# Patient Record
Sex: Female | Born: 1980 | Hispanic: Yes | Marital: Single | Smoking: Never smoker
Health system: Southern US, Community
[De-identification: ages and names within clinical notes are randomized; demographics above are authoritative.]

---

## 2004-08-22 ENCOUNTER — Emergency Department: Payer: Self-pay | Admitting: Emergency Medicine

## 2005-11-16 ENCOUNTER — Ambulatory Visit: Payer: Self-pay | Admitting: Family Medicine

## 2011-02-16 ENCOUNTER — Emergency Department: Payer: Self-pay | Admitting: Emergency Medicine

## 2011-06-12 ENCOUNTER — Emergency Department: Payer: Self-pay | Admitting: Emergency Medicine

## 2011-06-12 LAB — CBC
HCT: 38.5 % (ref 35.0–47.0)
HGB: 12.8 g/dL (ref 12.0–16.0)
MCHC: 33.3 g/dL (ref 32.0–36.0)
MCV: 85 fL (ref 80–100)
RBC: 4.52 10*6/uL (ref 3.80–5.20)
RDW: 16.2 % — ABNORMAL HIGH (ref 11.5–14.5)
WBC: 8.1 10*3/uL (ref 3.6–11.0)

## 2011-06-12 LAB — PROTIME-INR: Prothrombin Time: 13.2 secs (ref 11.5–14.7)

## 2011-06-12 LAB — COMPREHENSIVE METABOLIC PANEL
Albumin: 3.6 g/dL (ref 3.4–5.0)
Bilirubin,Total: 0.2 mg/dL (ref 0.2–1.0)
Chloride: 104 mmol/L (ref 98–107)
Co2: 24 mmol/L (ref 21–32)
Creatinine: 0.51 mg/dL — ABNORMAL LOW (ref 0.60–1.30)
EGFR (African American): >60
Potassium: 3.8 mmol/L (ref 3.5–5.1)
SGPT (ALT): 29 U/L

## 2011-06-12 LAB — CK TOTAL AND CKMB (NOT AT ARMC)
CK, Total: 77 U/L (ref 21–215)
CK-MB: 0.5 ng/mL (ref 0.5–3.6)

## 2011-06-12 LAB — PREGNANCY, URINE: Pregnancy Test, Urine: NEGATIVE m[IU]/mL

## 2011-06-12 LAB — APTT: Activated PTT: 30.8 secs (ref 23.6–35.9)

## 2011-06-12 LAB — TROPONIN I: Troponin-I: <0.02 ng/mL

## 2013-01-30 IMAGING — CR DG CHEST 2V
1 series · 2 of 2 positions shown · non-contrast
Comparison: none

REASON FOR EXAM: left-sided chest pain
COMMENTS:

[Series 1: w chest pa · 0.14mm/px · 2 of 2 slices shown]
[im 1/2]
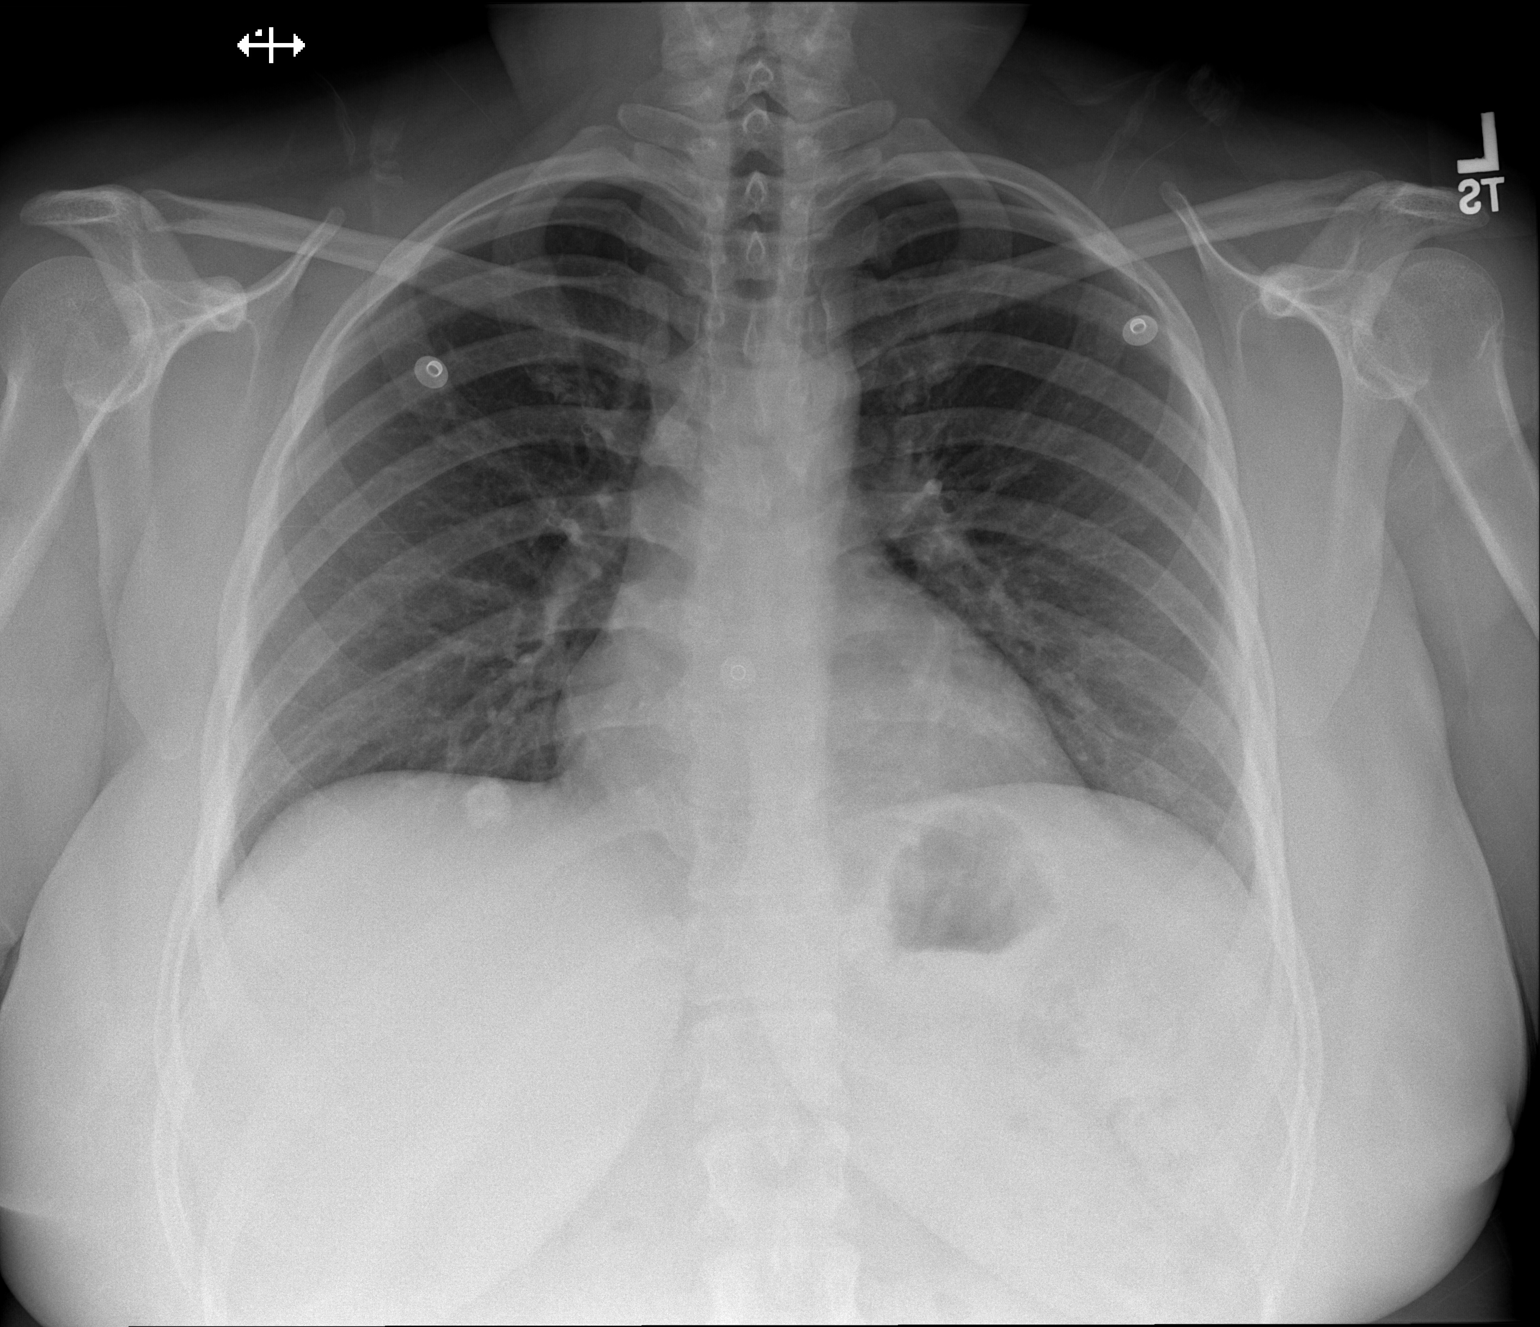
[im 2/2]
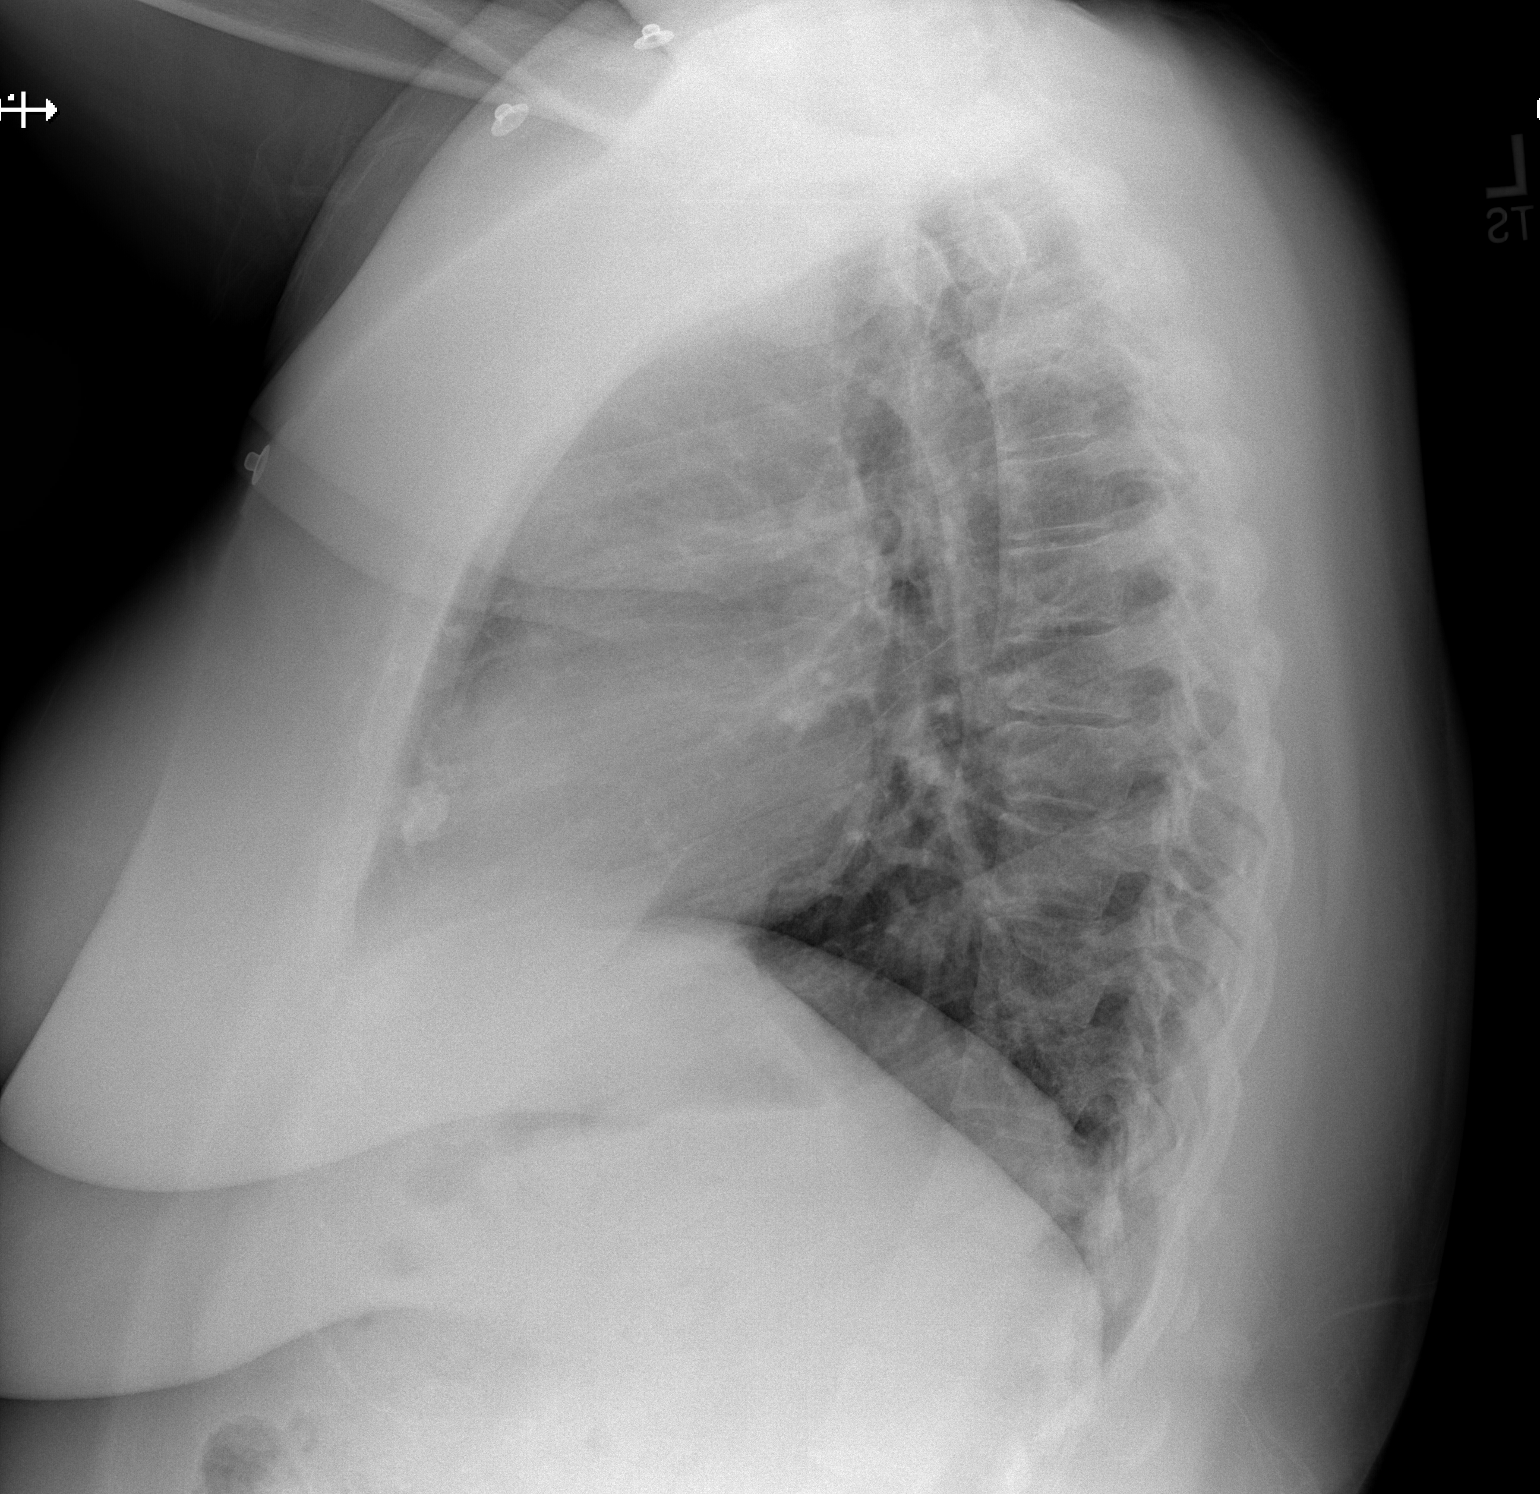

[2 of 2 positions shown; findings below may reference images not displayed]

PROCEDURE:     DXR - DXR CHEST PA (OR AP) AND LATERAL  - June 12, 2011  [DATE]

RESULT:     There is no previous exam for comparison.

The lungs are clear. The heart and pulmonary vessels are normal. The bony
and mediastinal structures are unremarkable. There is no effusion. There is
no pneumothorax or evidence of congestive failure.
IMPRESSION: No acute cardiopulmonary disease.

## 2013-01-30 IMAGING — CT CT CHEST W/ CM
2 series · 15 of 31 positions shown, 19 images · IV contrast (APPLIED)
Comparison: none

REASON FOR EXAM: pleuritic chest pain + elevated ddimer
COMMENTS:

PROCEDURE:     CT  - CT CHEST (FOR PE) W  - June 12, 2011  [DATE]
RESULT:     Comparison: None
TECHNIQUE: Multiple thin section axial images were obtained from the lung
apices to the upper abdomen following 100 ml Isovue 370 intravenous
contrast, according to the PE protocol. These images were also reviewed on a
[employer] multiplanar work station.

[Series 4: soft tissue · axial · 0.75mm/px · z∈[-926,-884]mm · 2 of 94 slices shown]
[im 8/94  mediastinal]
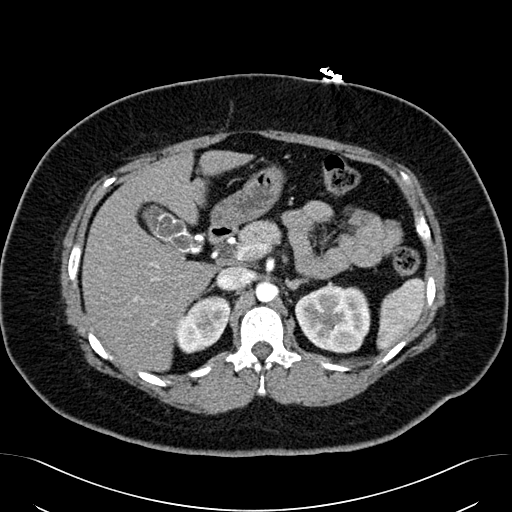
[im 22/94  mediastinal]
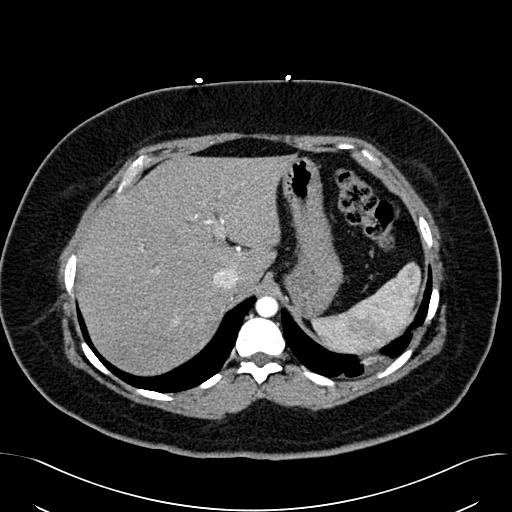

[Series 5: lung windows · axial · 0.75mm/px · z∈[-920,-690]mm · 13 of 92 slices shown, 17 images]
[im 8/92  mediastinal]
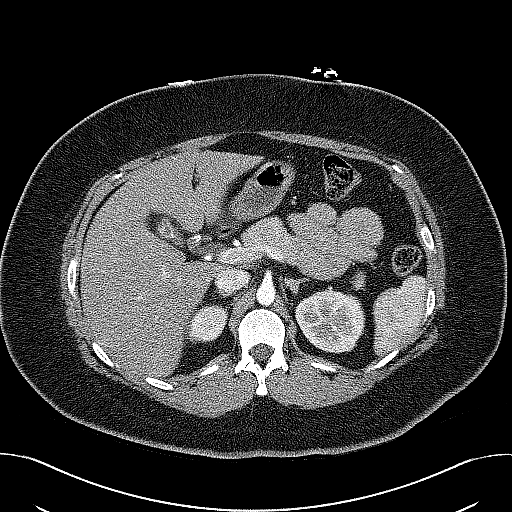
[im 8/92  lung]
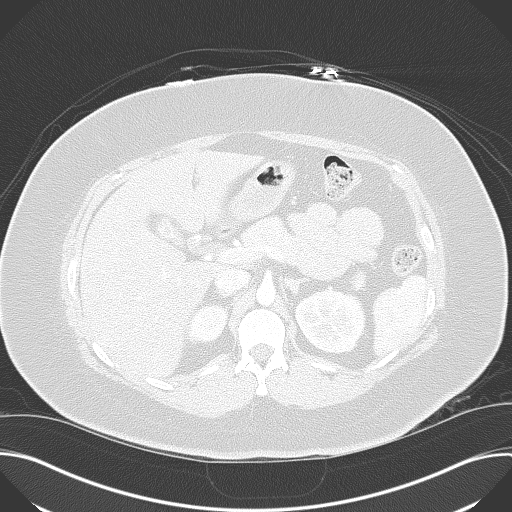
[im 15/92  lung]
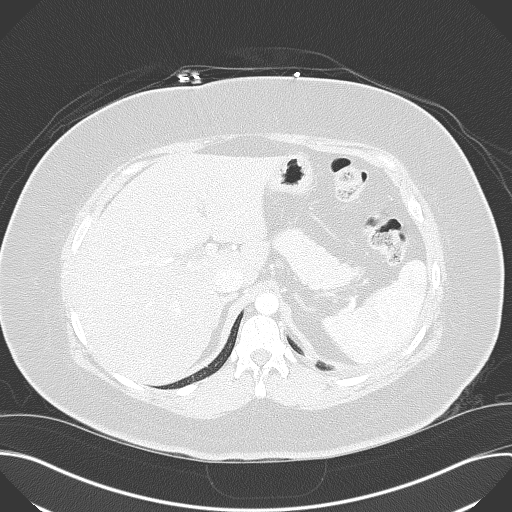
[im 22/92  lung]
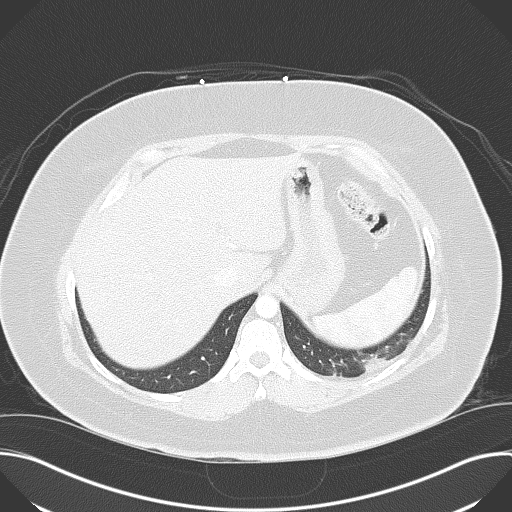
[im 29/92  lung]
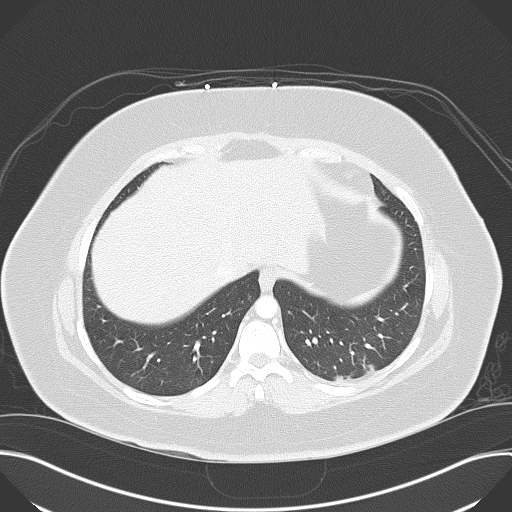
[im 36/92  mediastinal]
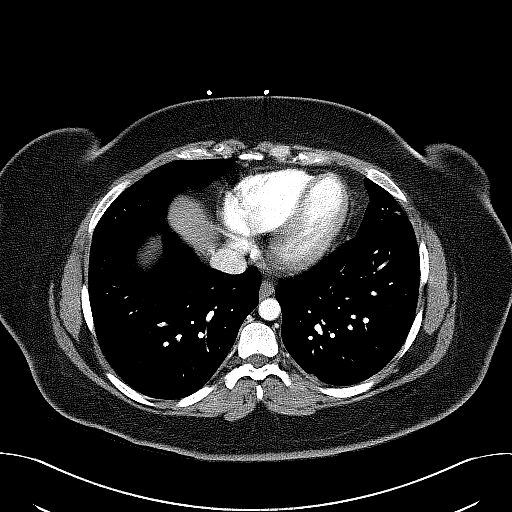
[im 36/92  lung]
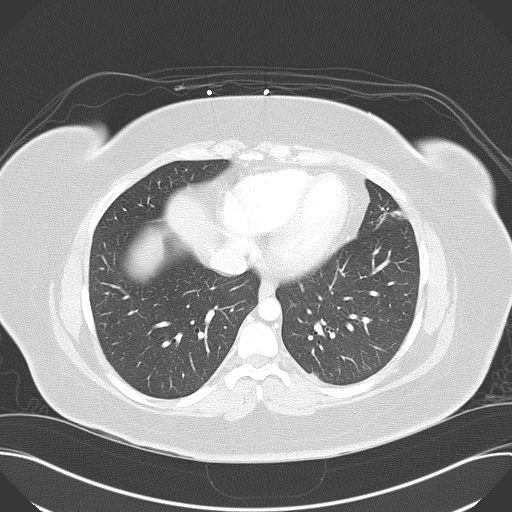
[im 43/92  lung]
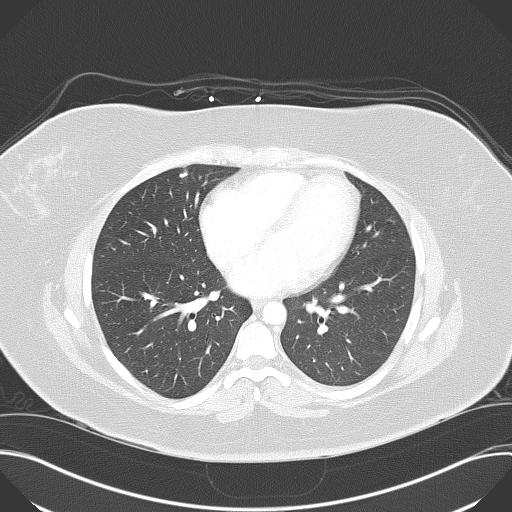
[im 46/92  lung]
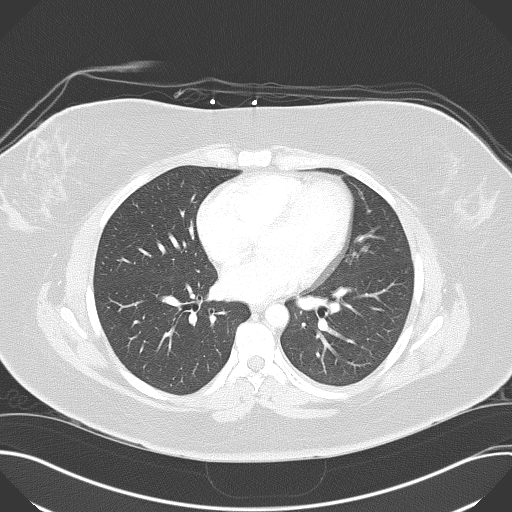
[im 50/92  lung]
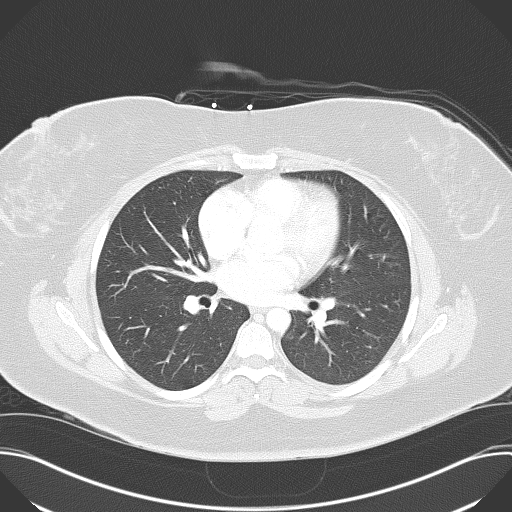
[im 57/92  mediastinal]
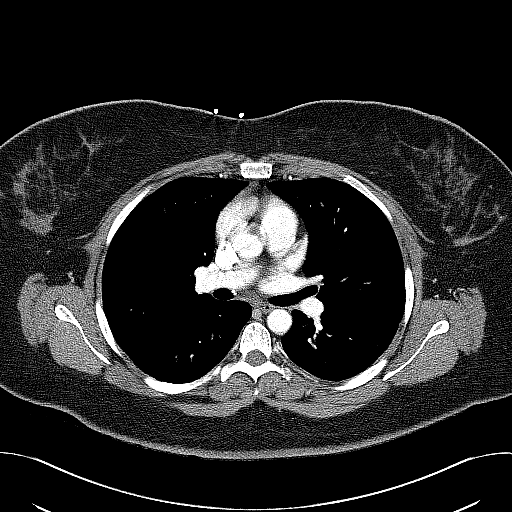
[im 57/92  lung]
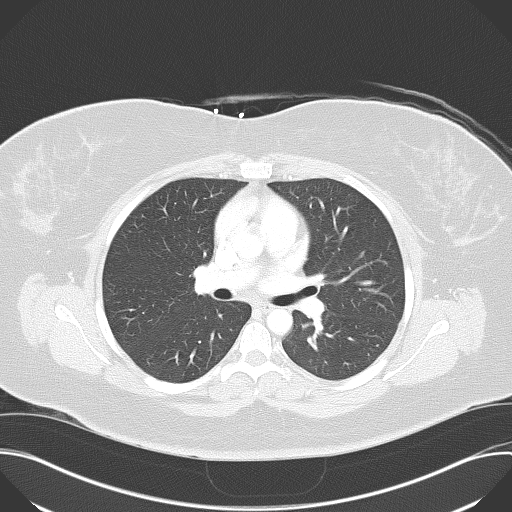
[im 64/92  lung]
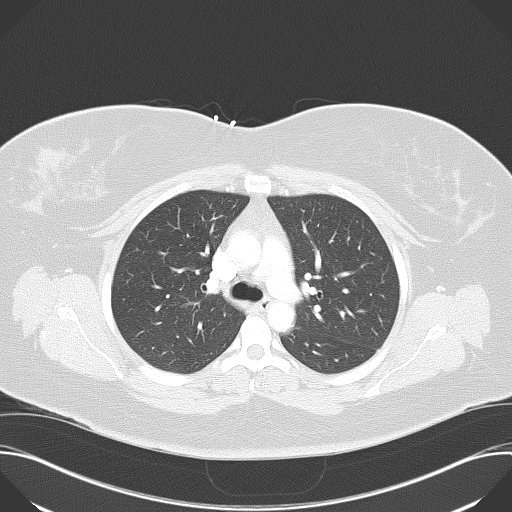
[im 71/92  lung]
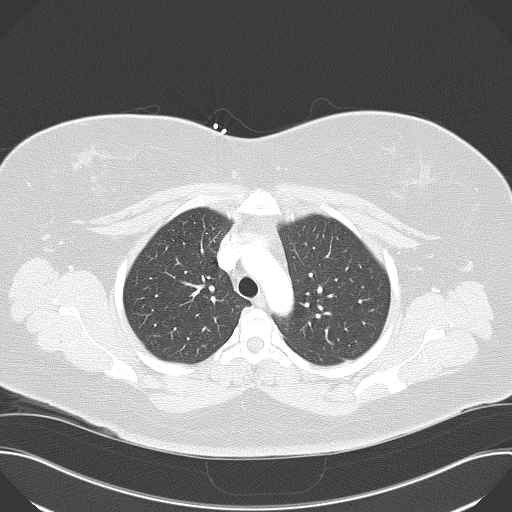
[im 78/92  lung]
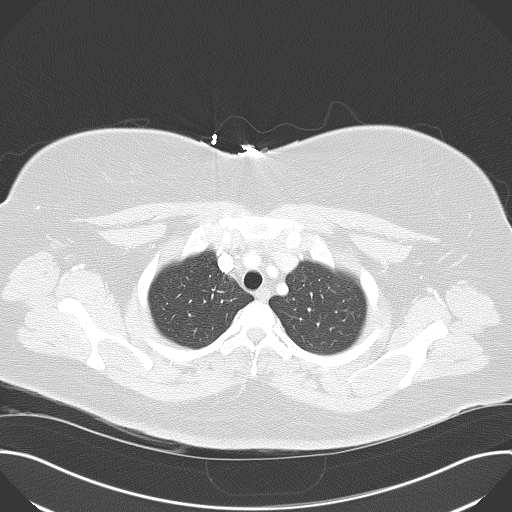
[im 85/92  mediastinal]
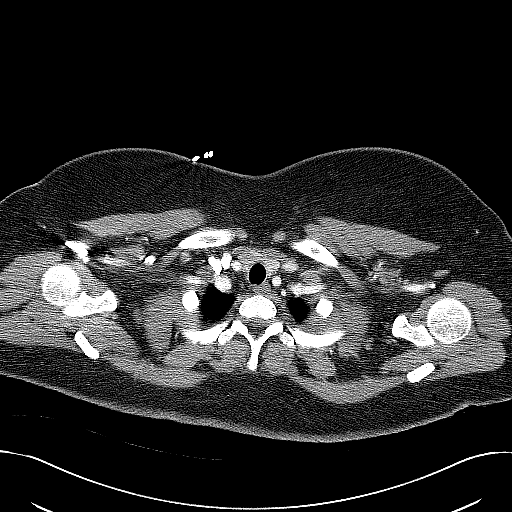
[im 85/92  lung]
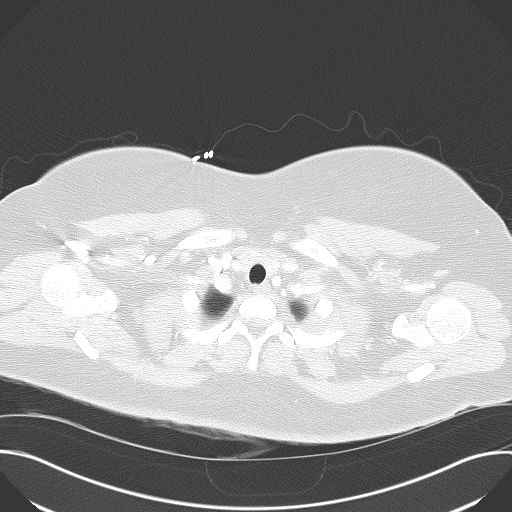

[15 of 31 positions shown; findings below may reference images not displayed]

FINDINGS: No mediastinal, hilar, or axillary lymphadenopathy. There are multiple
gallstones present. Calcifications in the subcarinal lymph nodes are likely
sequela of old prior infection.

The thoracic aorta is normal in caliber. No pulmonary embolus identified.

There is a large calcified nodule in the right middle lobe, likely sequela
of old prior infection. Mild left basilar opacities are likely secondary to
atelectasis.

No aggressive lytic or sclerotic osseous lesions are identified.
IMPRESSION: No pulmonary embolus identified.

## 2017-08-19 ENCOUNTER — Other Ambulatory Visit: Payer: Self-pay | Admitting: Physician Assistant

## 2017-08-19 DIAGNOSIS — Z3482 Encounter for supervision of other normal pregnancy, second trimester: Secondary | ICD-10-CM

## 2017-08-21 ENCOUNTER — Ambulatory Visit
Admission: RE | Admit: 2017-08-21 | Discharge: 2017-08-21 | Disposition: A | Discharge status: Home / Self Care | Payer: Self-pay | Source: Ambulatory Visit | Attending: Physician Assistant | Admitting: Physician Assistant

## 2017-08-21 DIAGNOSIS — Z3482 Encounter for supervision of other normal pregnancy, second trimester: Secondary | ICD-10-CM | POA: Insufficient documentation

## 2019-04-11 IMAGING — US US OB COMP +14 WK
1 series · 13 of 28 positions shown · non-contrast
Comparison: none

CLINICAL DATA: Anatomic scan

EXAM:
OBSTETRICAL ULTRASOUND >14 WKS

[Series 1: us ob comp +14 wk · 13 of 87 slices shown]
[im 4/87]
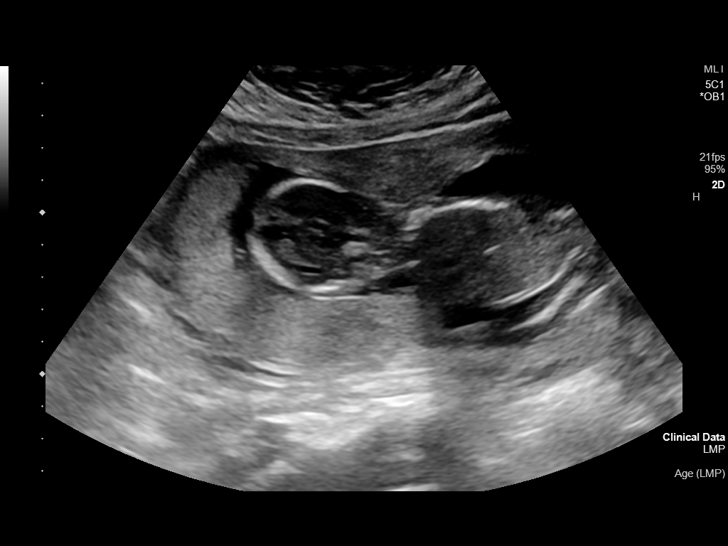
[im 10/87]
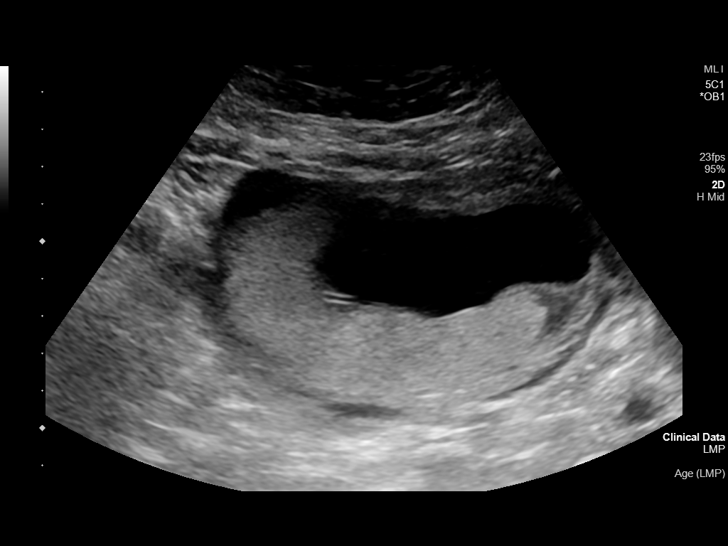
[im 16/87]
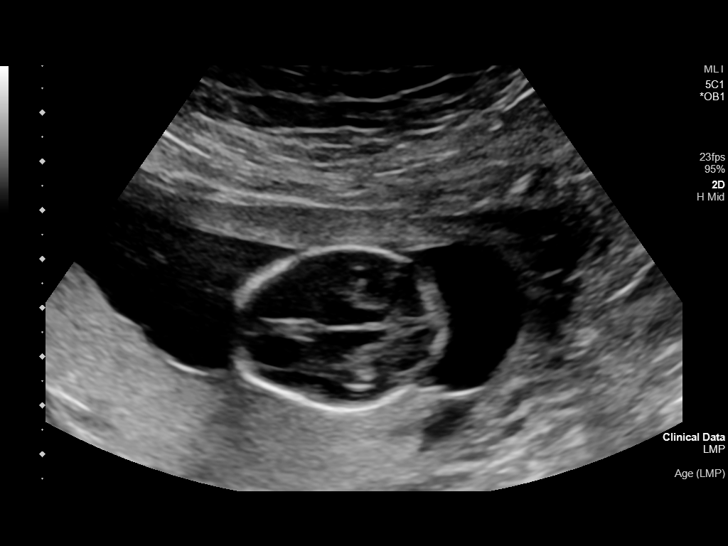
[im 23/87]
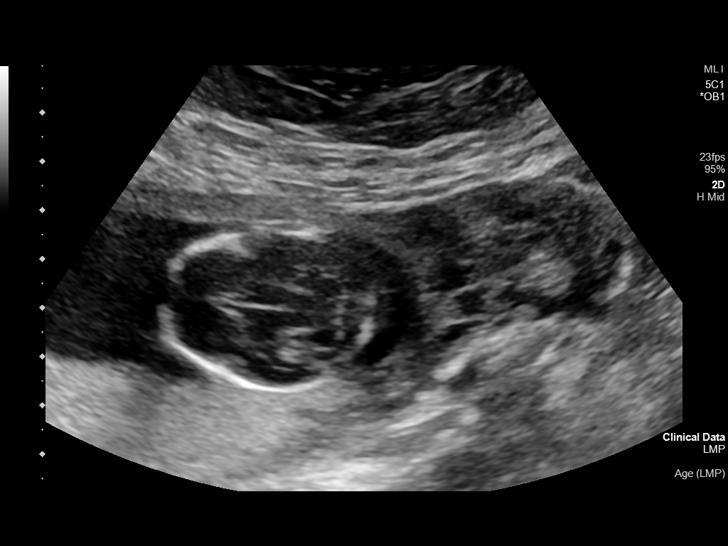
[im 29/87]
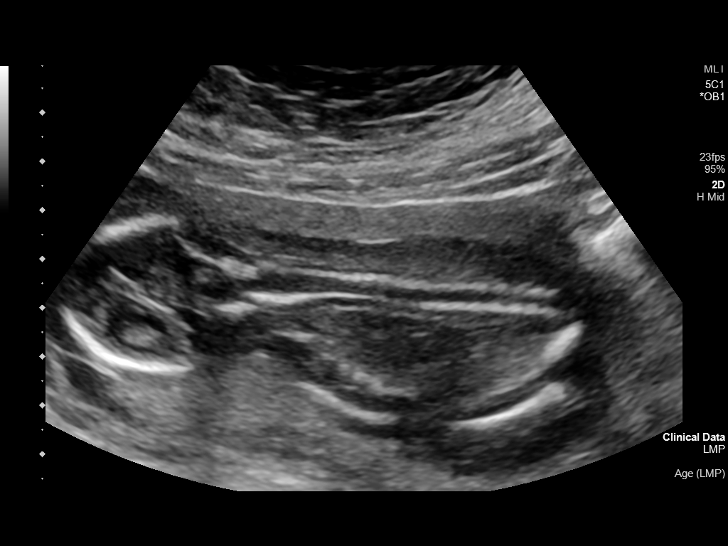
[im 36/87]
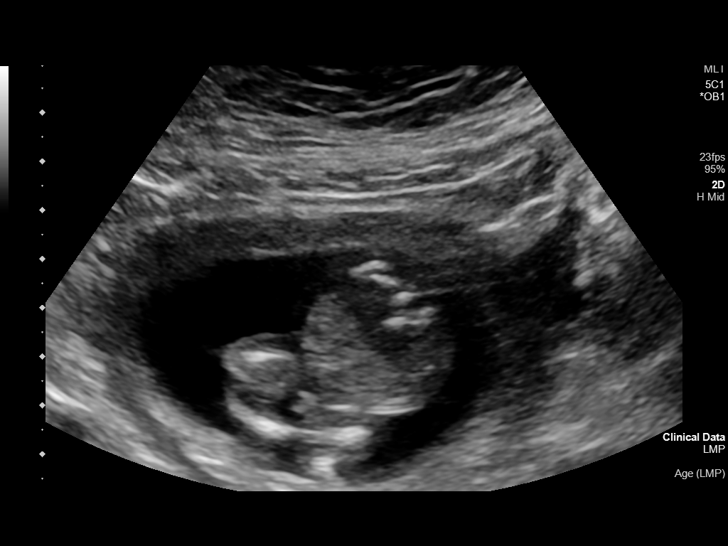
[im 45/87]
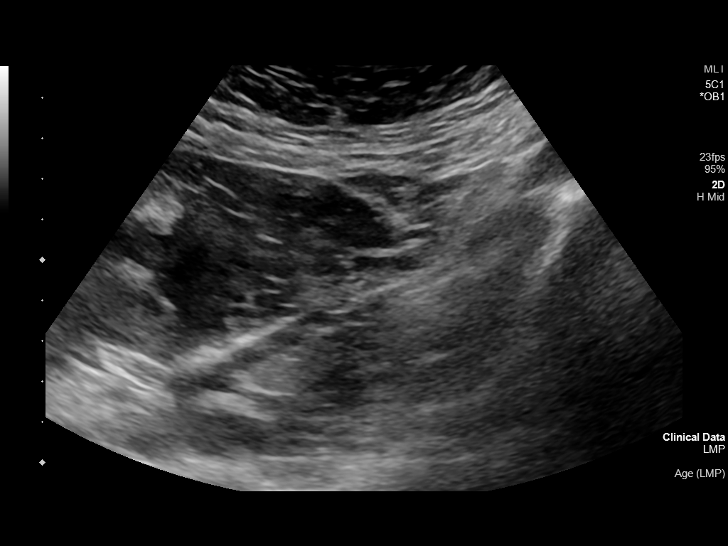
[im 51/87]
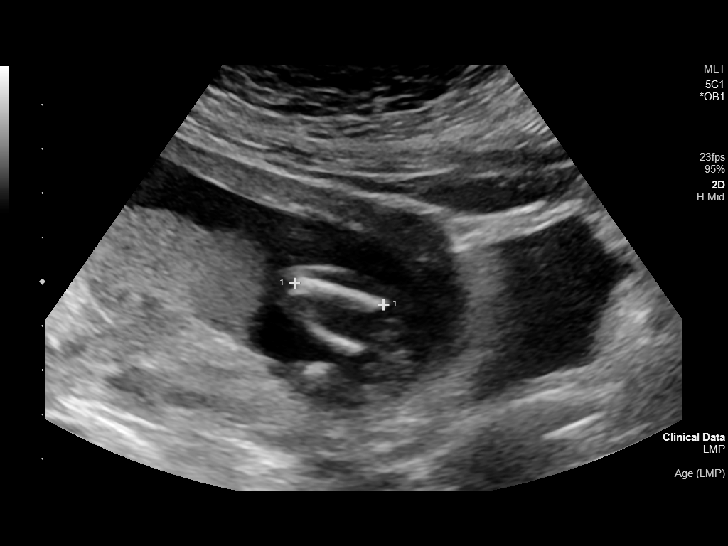
[im 58/87]
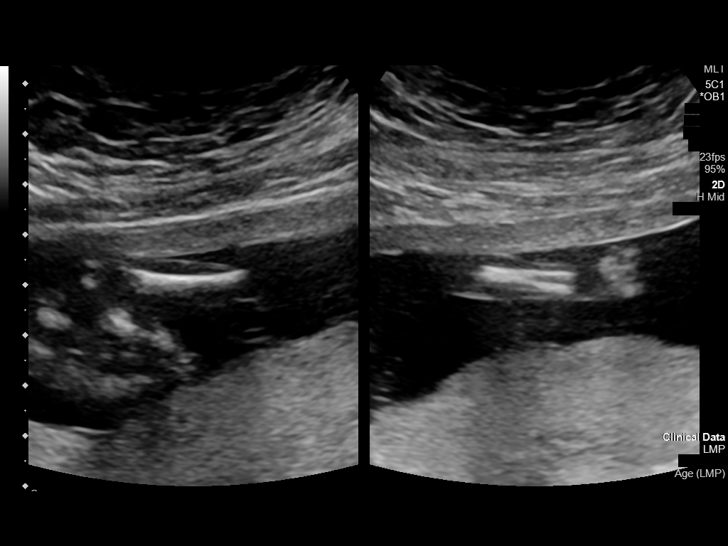
[im 64/87]
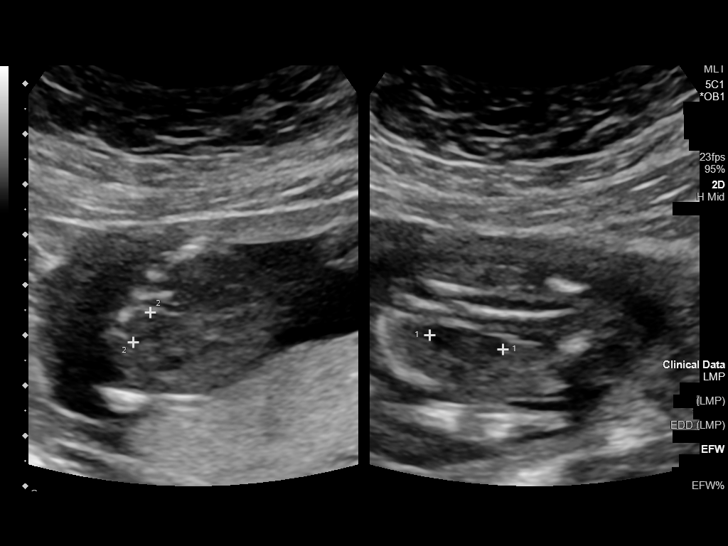
[im 71/87]
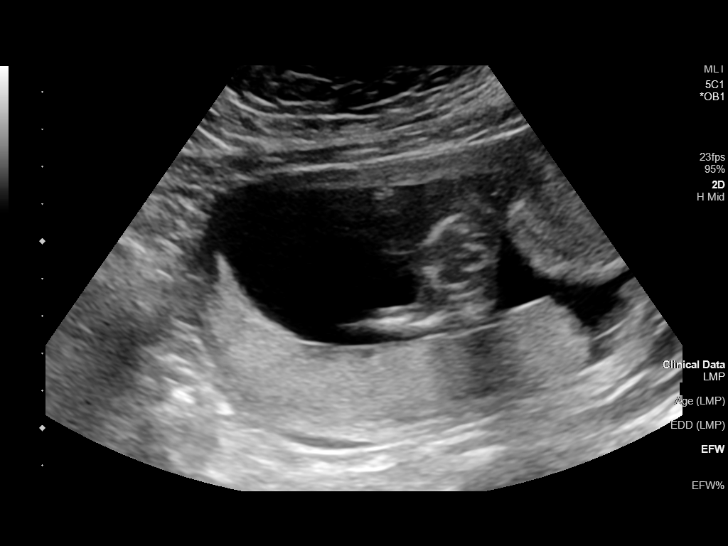
[im 77/87]
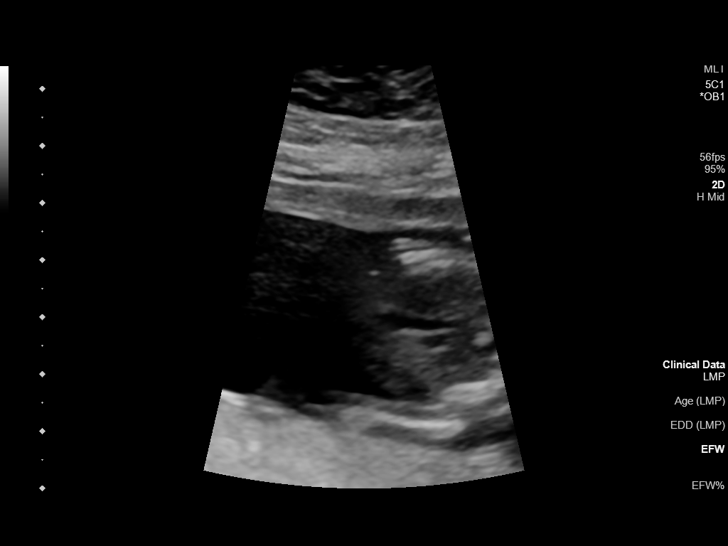
[im 83/87]
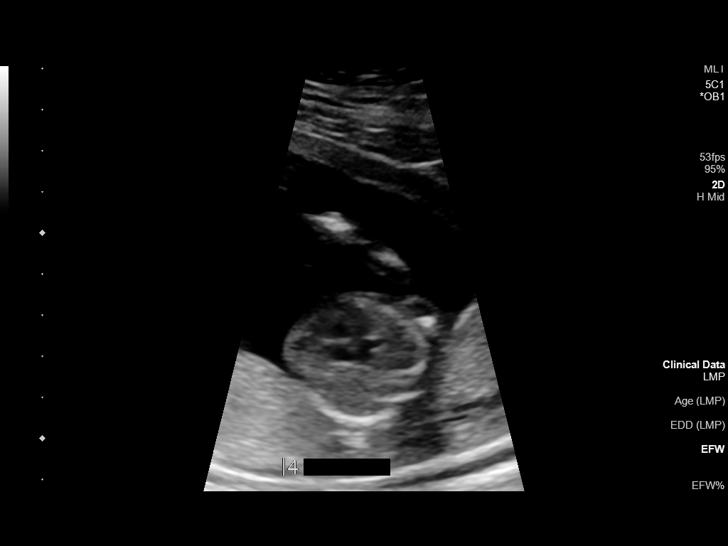

[13 of 28 positions shown; findings below may reference images not displayed]

FINDINGS: Number of Fetuses: 1

Heart Rate:  147 bpm

Movement: Present

Presentation: Breech

Previa: The distance from the lower placental segment to the
internal cervical os is 5.5 cm

Placental Location: Posterior

Amniotic Fluid (Subjective): Normal

Vertical pocket 4.4 cmcm

FETAL BIOMETRY

BPD:  3.28cm 16w 1d

HC:    12.67cm 16w 3d

AC:   10.3cm 16w 2d

FL:   2.06cm 16w 1d

Current Mean GA: 16w 1d US EDC: February 04, 2018.

FETAL ANATOMY

Lateral Ventricles: Visualized

Thalami/CSP: Visualized

Posterior Fossa:  Visualized

Nuchal Region: Visualized

Upper Lip: Visualized

Spine: Visualized

4 Chamber Heart on Left: Limited visualization

LVOT: Limited visualization

RVOT: Not visualized

Stomach on Left: Visualized

3 Vessel Cord: Visualized

Cord Insertion site: Visualized

Kidneys: Visualized

Bladder: Visualized

Extremities: Visualize

Sex: Male genitalia.

Technically difficult due to: Early gestational age and maternal
body habitus.

Maternal Findings: Uterus and adnexal regions within the limits of
normal.

Cervix:  3.3 cm, closed
IMPRESSION: Single viable IUP P with breech presentation and estimated
gestational age of 16 weeks 1 day with estimated date of confinement
February 04, 2018. This is 1 day less mature than that predicted
on clinical grounds. No fetal anomalies are observed but the
visualization of the cardiac structures was limited. The placenta is
posterior with no evidence of previa. The amniotic fluid volume is
estimated to be normal.

## 2020-05-14 ENCOUNTER — Other Ambulatory Visit: Payer: Self-pay

## 2020-05-14 DIAGNOSIS — Z20822 Contact with and (suspected) exposure to covid-19: Secondary | ICD-10-CM

## 2020-05-15 LAB — SARS-COV-2, NAA 2 DAY TAT

## 2020-05-15 LAB — NOVEL CORONAVIRUS, NAA: SARS-CoV-2, NAA: NOT DETECTED

## 2021-07-13 ENCOUNTER — Emergency Department: Payer: Self-pay

## 2021-07-13 ENCOUNTER — Emergency Department
Admission: EM | Admit: 2021-07-13 | Discharge: 2021-07-14 | Disposition: A | Discharge status: Home / Self Care | Payer: Self-pay | Attending: Emergency Medicine | Admitting: Emergency Medicine

## 2021-07-13 ENCOUNTER — Other Ambulatory Visit: Payer: Self-pay

## 2021-07-13 ENCOUNTER — Encounter: Payer: Self-pay | Admitting: Emergency Medicine

## 2021-07-13 DIAGNOSIS — A084 Viral intestinal infection, unspecified: Secondary | ICD-10-CM | POA: Insufficient documentation

## 2021-07-13 DIAGNOSIS — Z20822 Contact with and (suspected) exposure to covid-19: Secondary | ICD-10-CM | POA: Insufficient documentation

## 2021-07-13 DIAGNOSIS — R112 Nausea with vomiting, unspecified: Secondary | ICD-10-CM

## 2021-07-13 LAB — CBC WITH DIFFERENTIAL/PLATELET
Abs Immature Granulocytes: 0.02 10*3/uL (ref 0.00–0.07)
Basophils Absolute: 0 10*3/uL (ref 0.0–0.1)
Basophils Relative: 0 %
Eosinophils Absolute: 0 10*3/uL (ref 0.0–0.5)
Eosinophils Relative: 1 %
HCT: 42.8 % (ref 36.0–46.0)
Hemoglobin: 14.2 g/dL (ref 12.0–15.0)
Immature Granulocytes: 0 %
Lymphocytes Relative: 13 %
Lymphs Abs: 0.9 10*3/uL (ref 0.7–4.0)
MCH: 29.8 pg (ref 26.0–34.0)
MCHC: 33.2 g/dL (ref 30.0–36.0)
MCV: 89.9 fL (ref 80.0–100.0)
Monocytes Absolute: 0.3 10*3/uL (ref 0.1–1.0)
Monocytes Relative: 4 %
Neutro Abs: 5.6 10*3/uL (ref 1.7–7.7)
Neutrophils Relative %: 82 %
Platelets: 273 10*3/uL (ref 150–400)
RBC: 4.76 MIL/uL (ref 3.87–5.11)
RDW: 12.5 % (ref 11.5–15.5)
WBC: 6.8 10*3/uL (ref 4.0–10.5)
nRBC: 0 % (ref 0.0–0.2)

## 2021-07-13 MED ORDER — KETOROLAC TROMETHAMINE 15 MG/ML IJ SOLN
15.0000 mg | Freq: Once | INTRAMUSCULAR | Status: AC
  Administered 2021-07-14: 15 mg via INTRAVENOUS
  Filled 2021-07-13: qty 1

## 2021-07-13 MED ORDER — LACTATED RINGERS IV BOLUS
1000.0000 mL | Freq: Once | INTRAVENOUS | Status: AC
  Administered 2021-07-14: 1000 mL via INTRAVENOUS

## 2021-07-13 MED ORDER — ONDANSETRON HCL 4 MG/2ML IJ SOLN
4.0000 mg | Freq: Once | INTRAMUSCULAR | Status: AC
  Administered 2021-07-14: 4 mg via INTRAVENOUS
  Filled 2021-07-13: qty 2

## 2021-07-13 MED ORDER — FAMOTIDINE IN NACL 20-0.9 MG/50ML-% IV SOLN
20.0000 mg | Freq: Once | INTRAVENOUS | Status: AC
  Administered 2021-07-14: 20 mg via INTRAVENOUS
  Filled 2021-07-13: qty 50

## 2021-07-13 NOTE — ED Triage Notes (Addendum)
Pt reports upper abd pain radiating into upper back accomp by N/V and "acid" since yesterday; denies hx of same ?

## 2021-07-13 NOTE — ED Provider Notes (Signed)
? ?[facility] ?Provider Note ? ? ? Event Date/Time  ? First MD Initiated Contact with Patient 07/13/21 2332   ?  (approximate) ? ? ?History  ? ?Abdominal Pain ? ? ?HPI ? ?Kristina Herrera is a 41 y.o. female who presents for evaluation of vomiting, diarrhea, and abdominal pain.  Patient reports that her children at home have had vomiting and diarrhea.  She started with symptoms yesterday.  Reports several episodes of nonbloody nonbilious emesis and watery diarrhea.  Also has a mild cough and congestion.  She reports that this evening she started having severe epigastric sharp abdominal pain that radiated straight into her back.  After vomiting several times she reports that the pain has now improved but still persistent.  She does report a history of cholecystectomy but denies any other abdominal surgeries.  She has had body aches and chills but denies fever.  No chest pain, no shortness of breath. ?  ? ? ? ?Physical Exam  ? ?Triage Vital Signs: ?ED Triage Vitals [07/13/21 2333]  ?Enc Vitals Group  ?   BP   ?   Pulse   ?   Resp   ?   Temp   ?   Temp src   ?   SpO2   ?   Weight 180 lb (81.6 kg)  ?   Height 5\' 4"  (1.626 m)  ?   Head Circumference   ?   Peak Flow   ?   Pain Score 10  ?   Pain Loc   ?   Pain Edu?   ?   Excl. in GC?   ? ? ?Most recent vital signs: ?Vitals:  ? 07/14/21 0000 07/14/21 0234  ?BP: 117/68 (!) 99/53  ?Pulse: 82 83  ?Resp: 15 12  ?Temp:    ?SpO2: 95% 97%  ? ? ? ?Constitutional: Alert and oriented. Well appearing and in no apparent distress. ?HEENT: ?     Head: Normocephalic and atraumatic.    ?     Eyes: Conjunctivae are normal. Sclera is non-icteric.  ?     Mouth/Throat: Mucous membranes are moist.  ?     Neck: Supple with no signs of meningismus. ?Cardiovascular: Regular rate and rhythm. No murmurs, gallops, or rubs. 2+ symmetrical distal pulses are present in all extremities.  ?Respiratory: Normal respiratory effort. Lungs are clear to auscultation bilaterally.   ?Gastrointestinal: Soft, mild epigastric tenderness, and non distended with positive bowel sounds. No rebound or guarding. ?Genitourinary: No CVA tenderness. ?Musculoskeletal:  No edema, cyanosis, or erythema of extremities. ?Neurologic: Normal speech and language. Face is symmetric. Moving all extremities. No gross focal neurologic deficits are appreciated. ?Skin: Skin is warm, dry and intact. No rash noted. ?Psychiatric: Mood and affect are normal. Speech and behavior are normal. ? ?ED Results / Procedures / Treatments  ? ?Labs ?(all labs ordered are listed, but only abnormal results are displayed) ?Labs Reviewed  ?COMPREHENSIVE METABOLIC PANEL - Abnormal; Notable for the following components:  ?    Result Value  ? Glucose, Bld 121 (*)   ? Calcium 8.3 (*)   ? All other components within normal limits  ?URINALYSIS, COMPLETE (UACMP) WITH MICROSCOPIC - Abnormal; Notable for the following components:  ? Color, Urine YELLOW (*)   ? APPearance CLOUDY (*)   ? Specific Gravity, Urine 1.031 (*)   ? Ketones, ur 80 (*)   ? Protein, ur 30 (*)   ? Leukocytes,Ua TRACE (*)   ? All other  components within normal limits  ?RESP PANEL BY RT-PCR (FLU A&B, COVID) ARPGX2  ?URINE CULTURE  ?CBC WITH DIFFERENTIAL/PLATELET  ?LIPASE, BLOOD  ?HCG, QUANTITATIVE, PREGNANCY  ?TROPONIN I (HIGH SENSITIVITY)  ? ? ? ?EKG ? ?ED ECG REPORT ?I, Nita Sicklearolina Martinez Boxx, the attending physician, personally viewed and interpreted this ECG. ? ?Sinus rhythm with a rate of 88, normal intervals, normal axis, no ST elevations or depressions ? ?RADIOLOGY ?I, Nita Sicklearolina Hendryx Ricke, attending MD, have personally viewed and interpreted the images obtained during this visit as below: ? ?Chest x-ray is negative for acute disease ? ? ?___________________________________________________ ?Interpretation by Radiologist:  ?DG Chest Portable 1 View ? ?Result Date: 07/14/2021 ?CLINICAL DATA:  Cough EXAM: PORTABLE CHEST 1 VIEW COMPARISON:  06/12/2011 FINDINGS: Shallow inspiration.  Normal heart size and pulmonary vascularity. No focal airspace disease or consolidation in the lungs. No blunting of costophrenic angles. No pneumothorax. Mediastinal contours appear intact. Calcified granuloma in the right lung base is unchanged. IMPRESSION: No active disease. Electronically Signed   By: Burman NievesWilliam  Stevens M.D.   On: 07/14/2021 00:09   ? ? ? ?PROCEDURES: ? ?Critical Care performed: No ? ?Procedures ? ? ? ?IMPRESSION / MDM / ASSESSMENT AND PLAN / ED COURSE  ?I reviewed the triage vital signs and the nursing notes. ? ?41 y.o. female who presents for evaluation of vomiting, diarrhea, epigastric abdominal pain, cough, congestion, body aches and chills. Children at home with similar symptoms for the last few days.  On exam she is well-appearing in no distress with normal vital signs, normal work of breathing and normal sats.  Abdomen is soft with mild epigastric tenderness, no distention, no rebound or guarding. ? ?Ddx: Viral syndrome especially since patient has had respiratory and GI symptoms versus GERD/indigestion versus food poisoning versus gastritis versus pancreatitis versus retained biliary stone versus hepatitis versus myocarditis versus pericarditis ? ? ?Plan: CBC, CMP, lipase, pregnancy test, EKG, troponin, chest x-ray, urinalysis, COVID and flu.  Patient placed on telemetry for monitoring of cardiorespiratory status.  We will give IV fluids, IV Pepcid, IV Zofran, and IV Toradol for symptom relief ? ? ?MEDICATIONS GIVEN IN ED: ?Medications  ?ondansetron (ZOFRAN) injection 4 mg (4 mg Intravenous Given 07/14/21 0041)  ?famotidine (PEPCID) IVPB 20 mg premix (0 mg Intravenous Stopped 07/14/21 0234)  ?lactated ringers bolus 1,000 mL (0 mLs Intravenous Stopped 07/14/21 0234)  ?ketorolac (TORADOL) 15 MG/ML injection 15 mg (15 mg Intravenous Given 07/14/21 0042)  ? ? ? ?ED COURSE: Labs showing no leukocytosis, no anemia, no significant electrolyte derangements, normal LFTs and lipase, negative pregnancy test.   UA with some ketones but no signs of UTI.  COVID and flu negative.  Chest x-ray with no signs of pneumonia.  EKG and troponin with no signs of myocarditis or demand ischemia.  After receiving IV Toradol, fluids, IV Pepcid and Zofran patient feels markedly improved.  Serial abdominal exams with no tenderness.  Tolerating p.o. with no further episodes of vomiting.  Will discharge home with Zofran, increase oral hydration and bland diet for possible viral gastroenteritis.  Discussed my return precautions for new or worsening abdominal pain, fever, or signs of dehydration.  Admission was considered but with patient feeling markedly improved, unremarkable work-up, and repeat abdominal exam with no tenderness I feel she is safe for discharge home. ? ? ?Consults: none ? ? ?EMR reviewed including records from patient's last admission in 2021 for childbirth ? ? ? ?FINAL CLINICAL IMPRESSION(S) / ED DIAGNOSES  ? ?Final diagnoses:  ?Nausea vomiting and  diarrhea  ?Viral gastroenteritis  ? ? ? ?Rx / DC Orders  ? ?ED Discharge Orders   ? ?      Ordered  ?  ondansetron (ZOFRAN-ODT) 4 MG disintegrating tablet  Every 8 hours PRN,   Status:  Discontinued       ? 07/14/21 0259  ?  ondansetron (ZOFRAN-ODT) 4 MG disintegrating tablet  Every 8 hours PRN       ? 07/14/21 0351  ? ?  ?  ? ?  ? ? ? ?Note:  This document was prepared using Dragon voice recognition software and may include unintentional dictation errors. ? ? ?Please note:  Patient was evaluated in Emergency Department today for the symptoms described in the history of present illness. Patient was evaluated in the context of the global COVID-19 pandemic, which necessitated consideration that the patient might be at risk for infection with the SARS-CoV-2 virus that causes COVID-19. Institutional protocols and algorithms that pertain to the evaluation of patients at risk for COVID-19 are in a state of rapid change based on information released by regulatory bodies including the  CDC and federal and state organizations. These policies and algorithms were followed during the patient's care in the ED.  Some ED evaluations and interventions may be delayed as a result of limited staffing duri

## 2021-07-14 LAB — URINALYSIS, COMPLETE (UACMP) WITH MICROSCOPIC
Bacteria, UA: NONE SEEN
Bilirubin Urine: NEGATIVE
Glucose, UA: NEGATIVE mg/dL
Hgb urine dipstick: NEGATIVE
Ketones, ur: 80 mg/dL — AB
Nitrite: NEGATIVE
Protein, ur: 30 mg/dL — AB
Specific Gravity, Urine: 1.031 — ABNORMAL HIGH (ref 1.005–1.030)
pH: 5 (ref 5.0–8.0)

## 2021-07-14 LAB — COMPREHENSIVE METABOLIC PANEL
ALT: 23 U/L (ref 0–44)
AST: 28 U/L (ref 15–41)
Albumin: 4.4 g/dL (ref 3.5–5.0)
Alkaline Phosphatase: 67 U/L (ref 38–126)
Anion gap: 12 (ref 5–15)
BUN: 14 mg/dL (ref 6–20)
CO2: 22 mmol/L (ref 22–32)
Calcium: 8.3 mg/dL — ABNORMAL LOW (ref 8.9–10.3)
Chloride: 104 mmol/L (ref 98–111)
Creatinine, Ser: 0.53 mg/dL (ref 0.44–1.00)
GFR, Estimated: >60 mL/min (ref >60)
Glucose, Bld: 121 mg/dL — ABNORMAL HIGH (ref 70–99)
Potassium: 3.5 mmol/L (ref 3.5–5.1)
Sodium: 138 mmol/L (ref 135–145)
Total Bilirubin: 0.8 mg/dL (ref 0.3–1.2)
Total Protein: 8 g/dL (ref 6.5–8.1)

## 2021-07-14 LAB — HCG, QUANTITATIVE, PREGNANCY: hCG, Beta Chain, Quant, S: <1 m[IU]/mL (ref <5)

## 2021-07-14 LAB — LIPASE, BLOOD: Lipase: 28 U/L (ref 11–51)

## 2021-07-14 LAB — RESP PANEL BY RT-PCR (FLU A&B, COVID) ARPGX2
Influenza A by PCR: NEGATIVE
Influenza B by PCR: NEGATIVE
SARS Coronavirus 2 by RT PCR: NEGATIVE

## 2021-07-14 LAB — TROPONIN I (HIGH SENSITIVITY): Troponin I (High Sensitivity): 3 ng/L (ref <18)

## 2021-07-14 MED ORDER — ONDANSETRON 4 MG PO TBDP: 4.0000 mg | ORAL_TABLET | Freq: Three times a day (TID) | ORAL | 0 refills | Status: DC | PRN

## 2021-07-14 MED ORDER — ONDANSETRON 4 MG PO TBDP: 4.0000 mg | ORAL_TABLET | Freq: Three times a day (TID) | ORAL | 0 refills | Status: AC | PRN

## 2021-07-14 NOTE — ED Notes (Signed)
Interpreter name Maurine Minister [phone] was used to obtain assessment information.  ?

## 2021-07-14 NOTE — ED Notes (Signed)
Pt tolerated PO.  MD notified. 

## 2021-07-14 NOTE — ED Notes (Signed)
Pt given cranberry juice and saltine crackers for PO challenge. ?

## 2021-07-15 LAB — URINE CULTURE
